# Patient Record
Sex: Female | Born: 2000 | Hispanic: Yes | Marital: Single | Smoking: Never smoker
Health system: Southern US, Community
[De-identification: ages and names within clinical notes are randomized; demographics above are authoritative.]

## PROBLEM LIST (undated history)

## (undated) DIAGNOSIS — E063 Autoimmune thyroiditis: Secondary | ICD-10-CM

## (undated) HISTORY — PX: WISDOM TOOTH EXTRACTION: SHX21

## (undated) HISTORY — DX: Autoimmune thyroiditis: E06.3

---
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
## 2020-04-24 ENCOUNTER — Emergency Department
Admission: EM | Admit: 2020-04-24 | Discharge: 2020-04-24 | Disposition: A | Discharge status: Home / Self Care | Payer: Medicaid Other | Attending: Emergency Medicine | Admitting: Emergency Medicine

## 2020-04-24 ENCOUNTER — Encounter: Payer: Self-pay | Admitting: Emergency Medicine

## 2020-04-24 ENCOUNTER — Other Ambulatory Visit: Payer: Self-pay

## 2020-04-24 DIAGNOSIS — E039 Hypothyroidism, unspecified: Secondary | ICD-10-CM | POA: Diagnosis not present

## 2020-04-24 DIAGNOSIS — Z3A01 Less than 8 weeks gestation of pregnancy: Secondary | ICD-10-CM | POA: Insufficient documentation

## 2020-04-24 DIAGNOSIS — O210 Mild hyperemesis gravidarum: Secondary | ICD-10-CM

## 2020-04-24 DIAGNOSIS — R112 Nausea with vomiting, unspecified: Secondary | ICD-10-CM | POA: Insufficient documentation

## 2020-04-24 DIAGNOSIS — O26891 Other specified pregnancy related conditions, first trimester: Secondary | ICD-10-CM | POA: Insufficient documentation

## 2020-04-24 LAB — CBC
HCT: 40.4 % (ref 36.0–46.0)
Hemoglobin: 13.5 g/dL (ref 12.0–15.0)
MCH: 30.5 pg (ref 26.0–34.0)
MCHC: 33.4 g/dL (ref 30.0–36.0)
MCV: 91.2 fL (ref 80.0–100.0)
Platelets: 246 10*3/uL (ref 150–400)
RBC: 4.43 MIL/uL (ref 3.87–5.11)
RDW: 13.1 % (ref 11.5–15.5)
WBC: 12.2 10*3/uL — ABNORMAL HIGH (ref 4.0–10.5)
nRBC: 0 % (ref 0.0–0.2)

## 2020-04-24 LAB — URINALYSIS, COMPLETE (UACMP) WITH MICROSCOPIC
Bacteria, UA: NONE SEEN
Bilirubin Urine: NEGATIVE
Glucose, UA: NEGATIVE mg/dL
Hgb urine dipstick: NEGATIVE
Ketones, ur: 80 mg/dL — AB
Nitrite: NEGATIVE
Protein, ur: NEGATIVE mg/dL
Specific Gravity, Urine: 1.03 (ref 1.005–1.030)
pH: 5 (ref 5.0–8.0)

## 2020-04-24 LAB — COMPREHENSIVE METABOLIC PANEL
ALT: 15 U/L (ref 0–44)
AST: 13 U/L — ABNORMAL LOW (ref 15–41)
Albumin: 4.2 g/dL (ref 3.5–5.0)
Alkaline Phosphatase: 42 U/L (ref 38–126)
Anion gap: 8 (ref 5–15)
BUN: 8 mg/dL (ref 6–20)
CO2: 21 mmol/L — ABNORMAL LOW (ref 22–32)
Calcium: 9.1 mg/dL (ref 8.9–10.3)
Chloride: 106 mmol/L (ref 98–111)
Creatinine, Ser: 0.55 mg/dL (ref 0.44–1.00)
GFR, Estimated: >60 mL/min (ref >60)
Glucose, Bld: 115 mg/dL — ABNORMAL HIGH (ref 70–99)
Potassium: 3.7 mmol/L (ref 3.5–5.1)
Sodium: 135 mmol/L (ref 135–145)
Total Bilirubin: 0.9 mg/dL (ref 0.3–1.2)
Total Protein: 7.7 g/dL (ref 6.5–8.1)

## 2020-04-24 LAB — POC URINE PREG, ED: Preg Test, Ur: POSITIVE — AB

## 2020-04-24 LAB — LIPASE, BLOOD: Lipase: 22 U/L (ref 11–51)

## 2020-04-24 MED ORDER — ONDANSETRON 4 MG PO TBDP: 4.0000 mg | ORAL_TABLET | Freq: Three times a day (TID) | ORAL | 0 refills | Status: DC | PRN

## 2020-04-24 NOTE — ED Triage Notes (Signed)
Patient reports nausea and vomiting since yesterday evening.  Patient is complaining of epigastric cramping pain.  Patient states she is pregnant but unsure of exactly how many weeks, believes she is approx. [redacted] weeks pregnant.  LMP:  January 14th.  (est. 7 weeks 4 days)

## 2020-04-24 NOTE — ED Provider Notes (Signed)
[facility] Emergency Department Provider Note   ____________________________________________    I have reviewed the triage vital signs and the nursing notes.   HISTORY  Chief Complaint Nausea and vomiting    HPI Whitney Mills is a 20 y.o. female who presents with complaints of nausea and vomiting.  Patient reports she is approximately 7 to [redacted] weeks pregnant, this morning she has been having nausea and vomiting, this is her third pregnancy.  She denies fevers or chills.  No diarrhea.  Denies abdominal pain to me, some cramping while vomiting.  Has not seen OB yet but does have appointment no vaginal bleeding  Past Medical History:  Diagnosis Date  . Acquired autoimmune hypothyroidism    Dx 07/2014 with TSH of 19.8.  TPO Ab +, TG Ab +    Patient Active Problem List   Diagnosis Date Noted  . 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 Elevated TSH 08/25/2014  . 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 Obesity 08/25/2014    History reviewed. No pertinent surgical history.  Prior to Admission medications   Medication Sig Start Date End Date Taking? Authorizing Provider  ondansetron (ZOFRAN ODT) 4 MG disintegrating tablet Take 1 tablet (4 mg total) by mouth every 8 (eight) hours as needed. 04/24/20  Yes Jene Every, MD     Allergies Penicillins  Family History  Problem Relation Age of Onset  . Diabetes Mother   . Migraines Mother   . Diabetes Maternal Grandmother   . Breast cancer Neg Hx   . Ovarian cancer Neg Hx   . Colon cancer Neg Hx     Social History Social History   Tobacco Use  . Smoking status: Never Smoker  . Smokeless tobacco: Never Used  Vaping Use  . Vaping Use: Never used  Substance Use Topics  . Alcohol use: No  . Drug use: Not Currently    Types: Marijuana    Review of Systems  Constitutional: No fever/chills Eyes: No visual changes.  ENT: No sore throat. Cardiovascular: Denies chest pain. Respiratory:  Denies shortness of breath. Gastrointestinal: As above Genitourinary: Negative for dysuria. Musculoskeletal: Negative for back pain. Skin: Negative for rash. Neurological: Negative for headaches or weakness   ____________________________________________   PHYSICAL EXAM:  VITAL SIGNS: ED Triage Vitals  Enc Vitals Group     BP 04/24/20 0946 (!) 147/85     Pulse Rate 04/24/20 0946 83     Resp 04/24/20 0946 16     Temp 04/24/20 0946 98.7 F (37.1 C)     Temp Source 04/24/20 0946 Oral     SpO2 04/24/20 0946 100 %     Weight 04/24/20 0950 95.3 kg (210 lb)     Height 04/24/20 0950 1.575 m (5\' 2" )     Head Circumference --      Peak Flow --      Pain Score 04/24/20 0947 4     Pain Loc --      Pain Edu? --      Excl. in GC? --     Constitutional: Alert and oriented.  Eyes: Conjunctivae are normal.   Nose: No congestion/rhinnorhea. Mouth/Throat: Mucous membranes are moist.    Cardiovascular: Normal rate, regular rhythm.  Good peripheral circulation. Respiratory: Normal respiratory effort.  No retractions. Lungs CTAB. Gastrointestinal: Soft and nontender. No distention.  No CVA tenderness.  Musculoskeletal: No lower extremity tenderness nor edema.  Warm and well perfused Neurologic:  Normal speech and language. No gross focal neurologic  deficits are appreciated.  Skin:  Skin is warm, dry and intact. No rash noted. Psychiatric: Mood and affect are normal. Speech and behavior are normal.  ____________________________________________   LABS (all labs ordered are listed, but only abnormal results are displayed)  Labs Reviewed  COMPREHENSIVE METABOLIC PANEL - Abnormal; Notable for the following components:      Result Value   CO2 21 (*)    Glucose, Bld 115 (*)    AST 13 (*)    All other components within normal limits  CBC - Abnormal; Notable for the following components:   WBC 12.2 (*)    All other components within normal limits  URINALYSIS, COMPLETE (UACMP) WITH  MICROSCOPIC - Abnormal; Notable for the following components:   Color, Urine YELLOW (*)    APPearance CLOUDY (*)    Ketones, ur 80 (*)    Leukocytes,Ua SMALL (*)    All other components within normal limits  POC URINE PREG, ED - Abnormal; Notable for the following components:   Preg Test, Ur POSITIVE (*)    All other components within normal limits  LIPASE, BLOOD   ____________________________________________  EKG  ED ECG REPORT I, Jene Every, the attending physician, personally viewed and interpreted this ECG.  Date: 04/24/2020  Rhythm: normal sinus rhythm QRS Axis: normal Intervals: normal ST/T Wave abnormalities: Nonspecific changes Narrative Interpretation: no evidence of acute ischemia  ____________________________________________  RADIOLOGY  None ____________________________________________   PROCEDURES  Procedure(s) performed: No  Procedures   Critical Care performed: No ____________________________________________   INITIAL IMPRESSION / ASSESSMENT AND PLAN / ED COURSE  Pertinent labs & imaging results that were available during my care of the patient were reviewed by me and considered in my medical decision making (see chart for details).  Patient presents with nausea and vomiting, she is approximately 7 to [redacted] weeks pregnant.  She has no significant abdominal pain/lower abdominal pain or vaginal bleeding.  Primary complaint is of nausea and vomiting, differential includes morning sickness versus gastroenteritis.  No diarrhea reported.  No fevers chills body aches.  She is well-appearing here with reassuring lab tests, mild nonspecific elevation of white blood cell count.  No vomiting while in the emergency department, okay for discharge for presumed was morning sickness, discussed morning sickness measures such as spreading meals apart, smaller meals, follow-up with OB.  Zofran Rx'd for rare use if severe nausea or vomiting     ____________________________________________   FINAL CLINICAL IMPRESSION(S) / ED DIAGNOSES  Final diagnoses:  Morning sickness        Note:  This document was prepared using Dragon voice recognition software and may include unintentional dictation errors.   Jene Every, MD 04/24/20 1253

## 2023-08-10 NOTE — Progress Notes (Unsigned)
    Shann Doyal DEL, MD   No chief complaint on file.   HPI:      Ms. Whitney Mills is a 23 y.o. [id number] whose LMP was No LMP recorded., presents today for NP > 3 yrs  NO PAP??    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  Family History  Problem Relation Age of Onset   Diabetes Mother    Migraines Mother    Diabetes Maternal Grandmother    Breast cancer Neg Hx    Ovarian cancer Neg Hx    Colon cancer Neg Hx     Social History   Socioeconomic History   Marital status: Single    Spouse name: Not on file   Number of children: Not on file   Years of education: Not on file   Highest education level: Not on file  Occupational History   Not on file  Tobacco Use   Smoking status: Never   Smokeless tobacco: Never  Vaping Use   Vaping status: Never Used  Substance and Sexual Activity   Alcohol use: No   Drug use: Not Currently    Types: Marijuana   Sexual activity: Not Currently    Partners: Male    Birth control/protection: Implant  Other Topics Concern   Not on file  Social History Narrative   Lives at home with mom and two sisters, will attend [employer] and start 9th grade.    Social Drivers of Corporate investment banker Strain: Not on file  Food Insecurity: Not on file  Transportation Needs: Not on file  Physical Activity: Not on file  Stress: Not on file  Social Connections: Not on file  Intimate Partner Violence: Not on file    Outpatient Medications Prior to Visit  Medication Sig Dispense Refill   ondansetron  (ZOFRAN  ODT) 4 MG disintegrating tablet Take 1 tablet (4 mg total) by mouth every 8 (eight) hours as needed. 20 tablet 0   No facility-administered medications prior to visit.      ROS:  Review of Systems BREAST: No symptoms   OBJECTIVE:    Vitals:  There were no vitals taken for this visit.  Physical Exam  Results: No results found for this or any previous visit (from the past 24 hours).   Assessment/Plan: No diagnosis found.    No orders of the defined types were placed in this encounter.     No follow-ups on file.  Arber Wiemers B. Jeniya Flannigan, PA-C 08/10/2023 10:15 AM

## 2023-08-11 ENCOUNTER — Other Ambulatory Visit (HOSPITAL_COMMUNITY)
Admission: RE | Admit: 2023-08-11 | Discharge: 2023-08-11 | Disposition: A | Discharge status: Home / Self Care | Source: Ambulatory Visit | Attending: Obstetrics and Gynecology | Admitting: Obstetrics and Gynecology

## 2023-08-11 ENCOUNTER — Ambulatory Visit: Admitting: Obstetrics and Gynecology

## 2023-08-11 ENCOUNTER — Encounter: Payer: Self-pay | Admitting: Obstetrics and Gynecology

## 2023-08-11 VITALS — BP 115/72 | HR 90 | Ht 62.0 in | Wt 196.0 lb

## 2023-08-11 DIAGNOSIS — Z113 Encounter for screening for infections with a predominantly sexual mode of transmission: Secondary | ICD-10-CM

## 2023-08-11 DIAGNOSIS — B9689 Other specified bacterial agents as the cause of diseases classified elsewhere: Secondary | ICD-10-CM | POA: Diagnosis not present

## 2023-08-11 DIAGNOSIS — Z124 Encounter for screening for malignant neoplasm of cervix: Secondary | ICD-10-CM

## 2023-08-11 DIAGNOSIS — N760 Acute vaginitis: Secondary | ICD-10-CM

## 2023-08-11 LAB — POCT WET PREP WITH KOH
Clue Cells Wet Prep HPF POC: POSITIVE
KOH Prep POC: POSITIVE — AB
Trichomonas, UA: NEGATIVE
Yeast Wet Prep HPF POC: NEGATIVE

## 2023-08-11 MED ORDER — CLINDAMYCIN HCL 300 MG PO CAPS: 300.0000 mg | ORAL_CAPSULE | Freq: Two times a day (BID) | ORAL | 0 refills | Status: AC

## 2023-08-11 NOTE — Patient Instructions (Addendum)
I value your feedback and you entrusting us with your care. If you get a [facility] patient survey, I would appreciate you taking the time to let us know about your experience today. Thank you!  HEALTHY VAGINAL HYGIENE  AVOID   Panytyhose Synthetic underwear (wear COTTON underwear)  Tight pants/jeans Thongs Pantyliners Scented soaps/shower gels (use Dove Sensitive Skin soap or water to clean) Bubble bath/bath bombs Scented detergents  ALL dryer sheets (line dry underwear if using them on your other clothing) Feminine sprays/douches   FOR RECURRENT BACTERIAL VAGINOSIS (BV) Above recommendations and ADD probiotics daily, USE CONDOMS  Visit [email or web address]    

## 2023-08-12 ENCOUNTER — Ambulatory Visit: Payer: Self-pay | Admitting: Obstetrics and Gynecology

## 2023-08-12 LAB — CYTOLOGY - PAP
Adequacy: ABSENT
Diagnosis: NEGATIVE

## 2023-08-12 MED ORDER — FLUCONAZOLE 150 MG PO TABS: 150.0000 mg | ORAL_TABLET | Freq: Once | ORAL | 0 refills | Status: AC

## 2023-08-12 NOTE — Addendum Note (Signed)
 Addended by: WATT HILA B on: 08/12/2023 05:59 PM   Modules accepted: Orders

## 2023-08-13 LAB — NUSWAB VG PLUS+MYCOPLASMAS,NAA
BVAB 2: HIGH {score} — AB
Candida albicans, NAA: NEGATIVE
Candida glabrata, NAA: NEGATIVE
Chlamydia trachomatis, NAA: NEGATIVE
Mycoplasma genitalium NAA: NEGATIVE
Mycoplasma hominis NAA: NEGATIVE
Neisseria gonorrhoeae, NAA: NEGATIVE
Trich vag by NAA: NEGATIVE
Ureaplasma spp NAA: POSITIVE — AB

## 2023-08-13 MED ORDER — DOXYCYCLINE HYCLATE 100 MG PO CAPS: 100.0000 mg | ORAL_CAPSULE | Freq: Two times a day (BID) | ORAL | 0 refills | Status: AC

## 2023-08-13 NOTE — Addendum Note (Signed)
 Addended by: WATT HILA B on: 08/13/2023 05:17 PM   Modules accepted: Orders
